# Patient Record
Sex: Male | Born: 1995 | Race: White | Hispanic: No | Marital: Single
Health system: Midwestern US, Community
[De-identification: ages and names within clinical notes are randomized; demographics above are authoritative.]

---

## 2014-06-19 ENCOUNTER — Inpatient Hospital Stay
Admit: 2014-06-19 | Discharge: 2014-06-19 | Disposition: A | Discharge status: Home / Self Care | Attending: Emergency Medicine
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
## 2020-10-14 IMAGING — CR KNEE RT 4 VWS MIN
1 series · 4 of 4 positions shown · non-contrast
Comparison: None

Right knee 4 views including weightbearing

INDICATIONS:  Right knee pain

[Series 1: tunnel · 0.17mm/px · 4 of 4 slices shown]
[im 1/4]
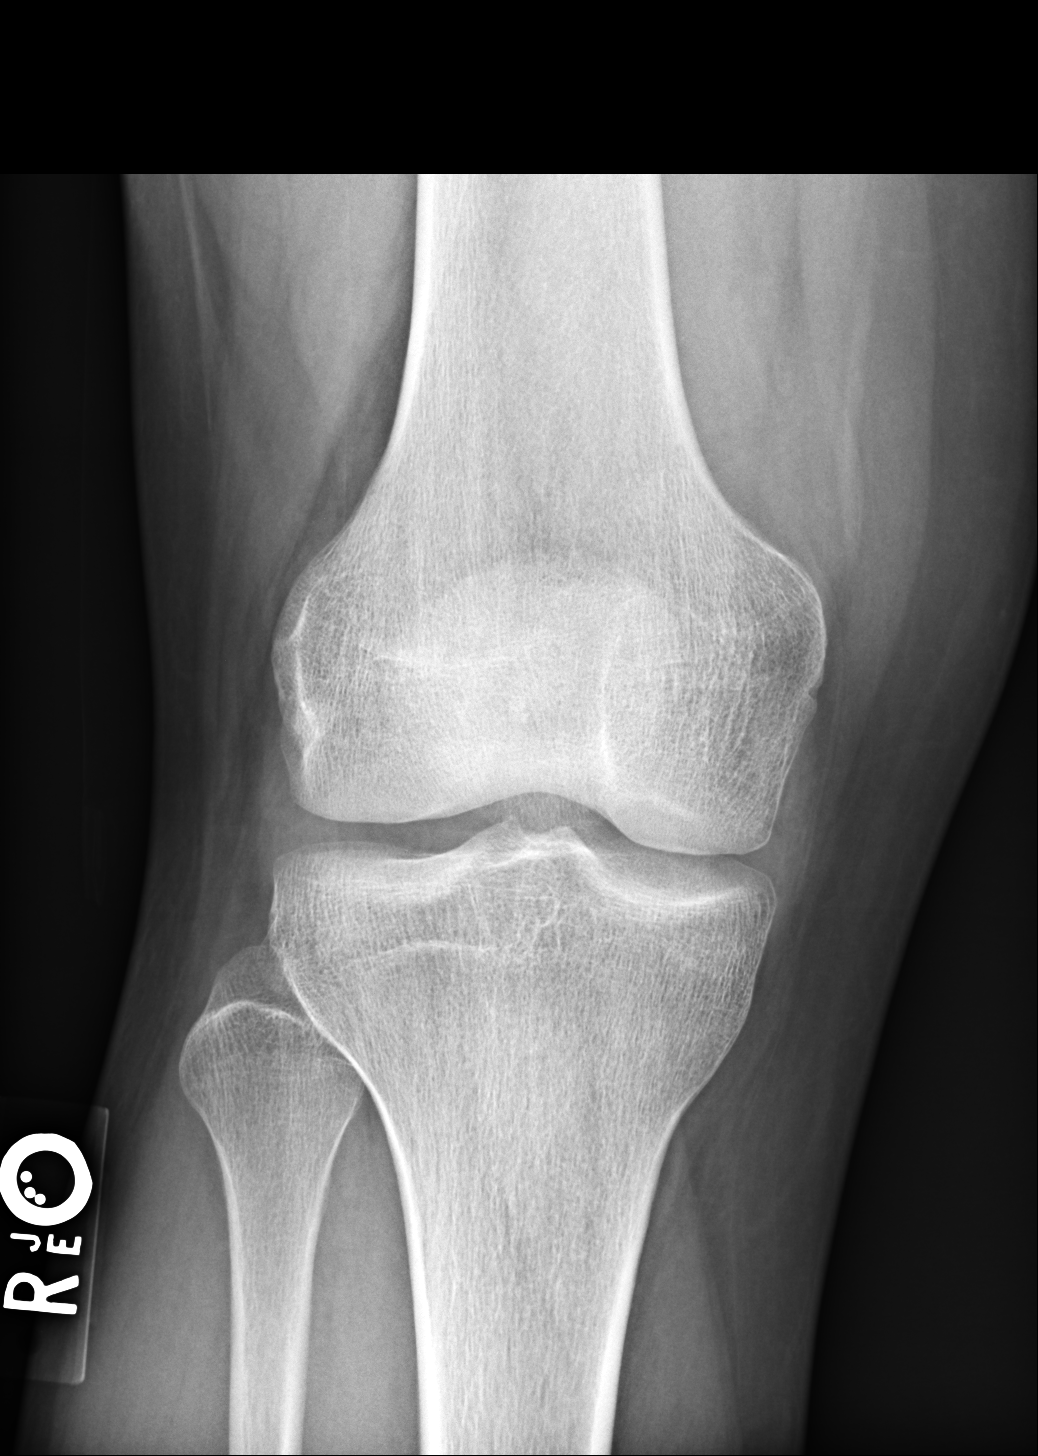
[im 2/4]
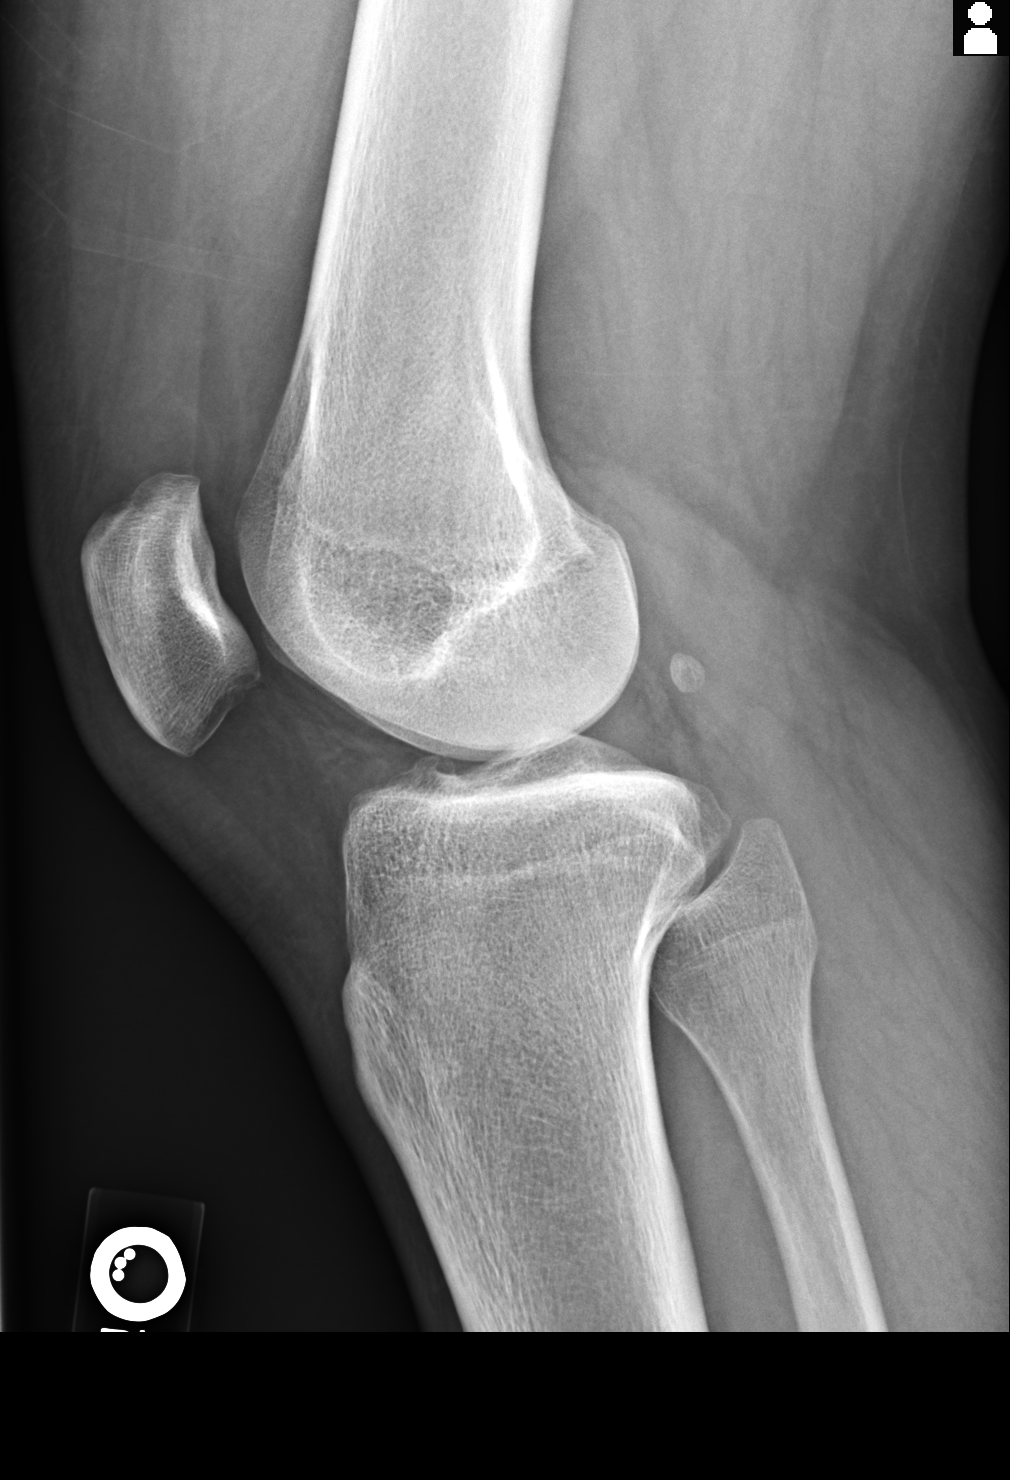
[im 3/4]
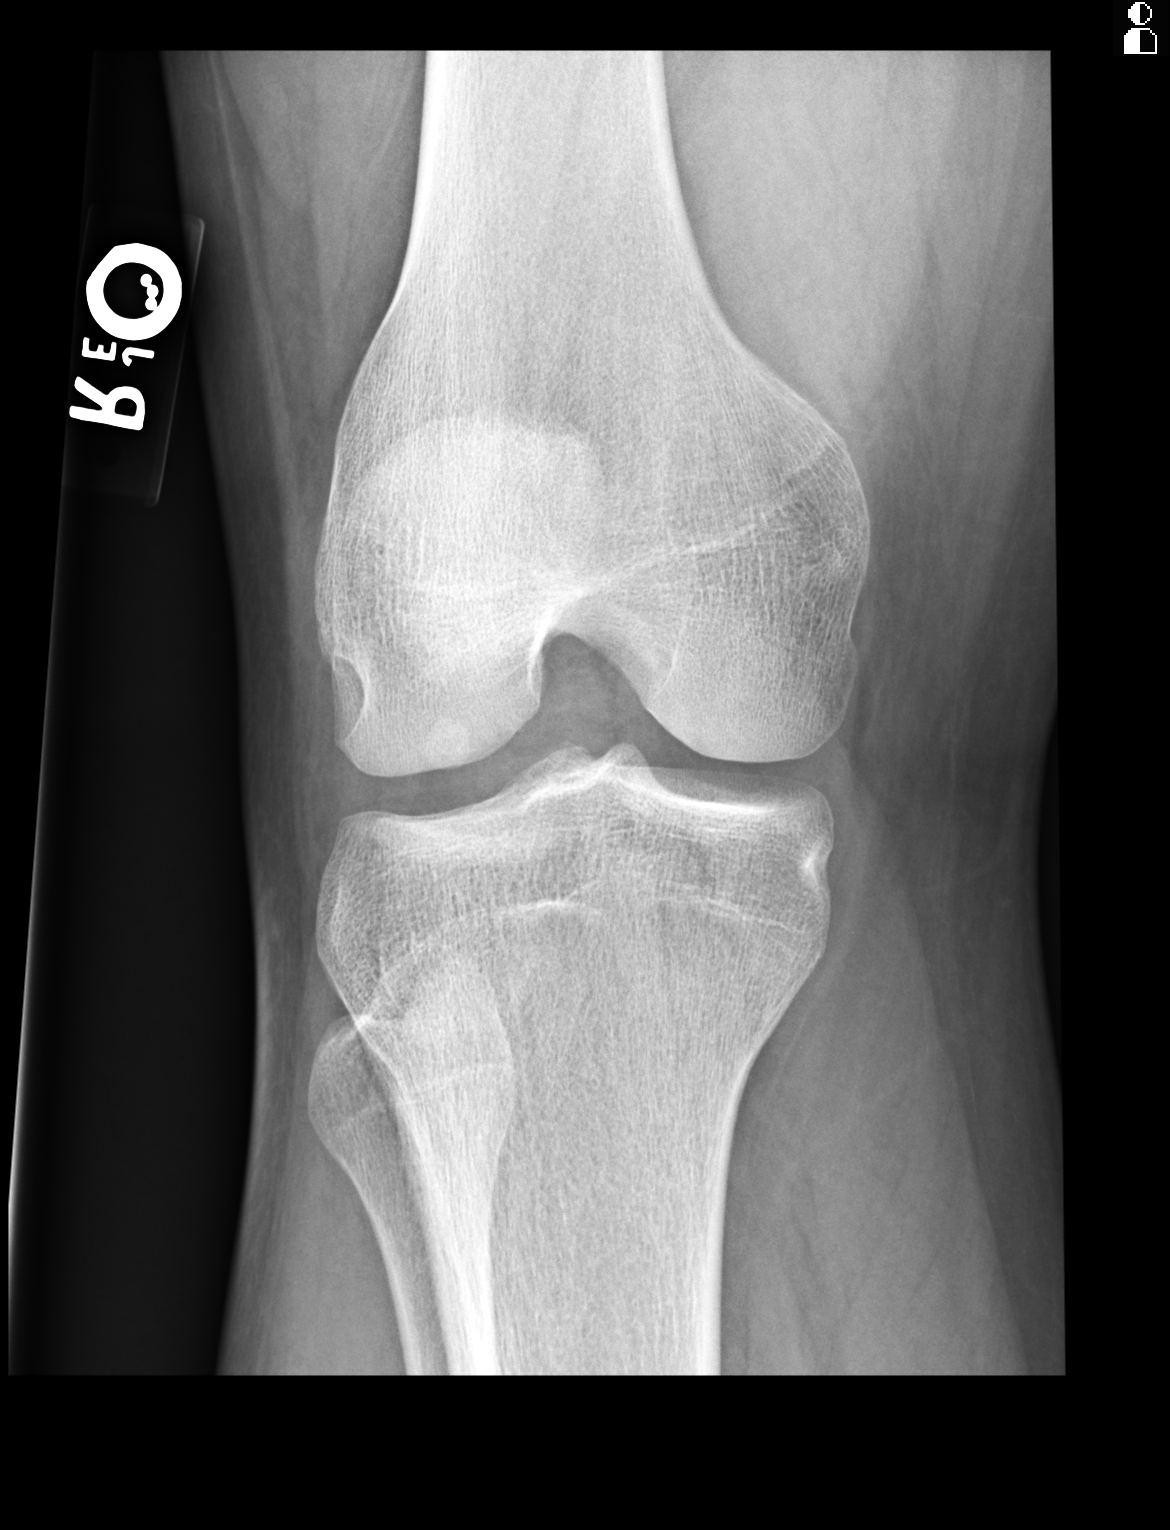
[im 4/4]
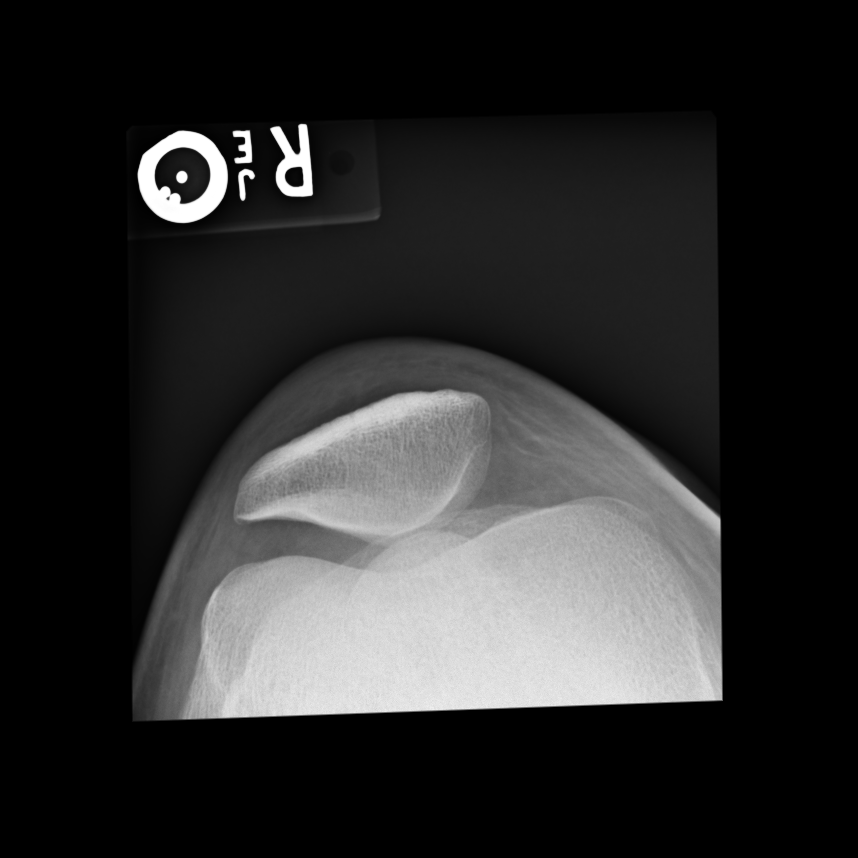

[4 of 4 positions shown; findings below may reference images not displayed]

FINDINGS: No bony or articular abnormality is identified. In particular, there is no evidence of fracture, dislocation, or radiopaque foreign body.
IMPRESSION: Normal right knee.

## 2021-06-26 IMAGING — MR MRI KNEE RT WO CONTRAST
5 series · 40 of 40 positions shown · non-contrast
Comparison: Right knee radiographs, 10/14/2020.

INDICATION: Strain of unspecified muscles and tendons at lower leg level, right leg, initial encounter, chronic knee pain, worse x 1 week
TECHNIQUE: Multiplanar, multisequence imaging of the right knee was performed without contrast.

[Series 3: t2_axial_fs · axial · 4.0mm · 0.62mm/px · z∈[-57,+78]mm · 8 of 28 slices shown]
[im 1/28]
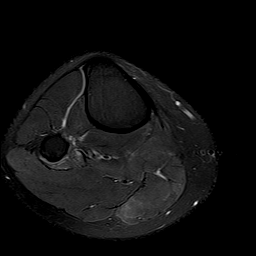
[im 4/28]
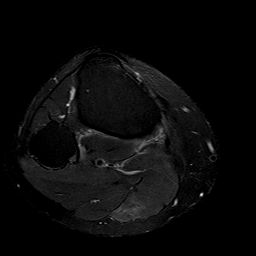
[im 8/28]
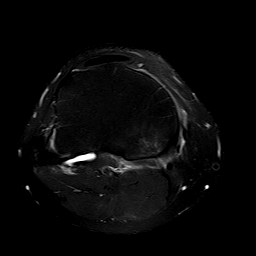
[im 12/28]
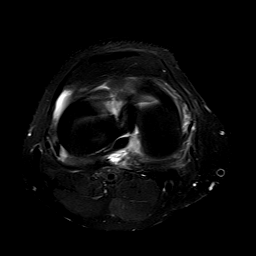
[im 16/28]
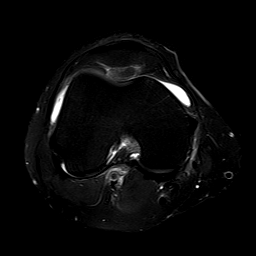
[im 20/28]
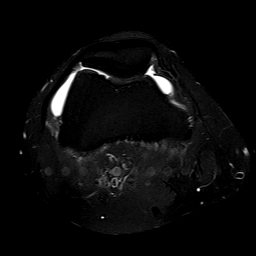
[im 24/28]
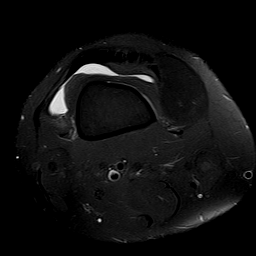
[im 28/28]
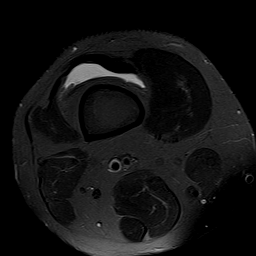

[Series 5: pd_sag_fs · sagittal · 3.0mm · 0.59mm/px · 9 of 28 slices shown]
[im 1/28]
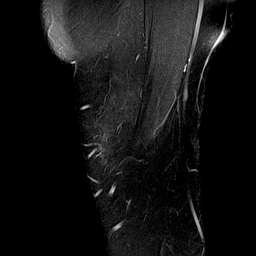
[im 4/28]
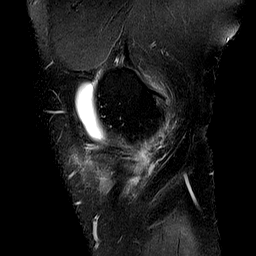
[im 7/28]
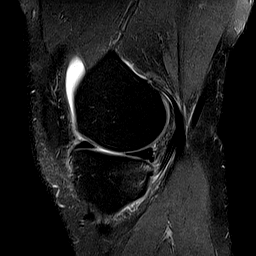
[im 11/28]
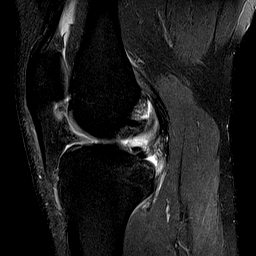
[im 14/28]
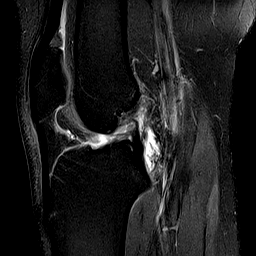
[im 17/28]
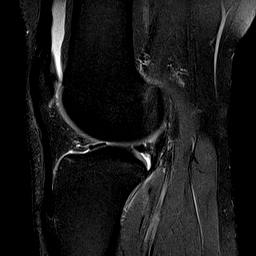
[im 21/28]
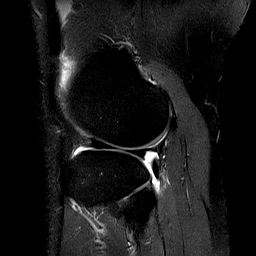
[im 24/28]
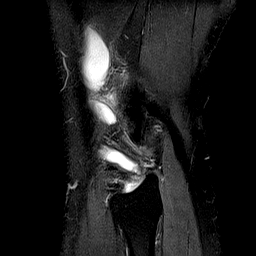
[im 28/28]
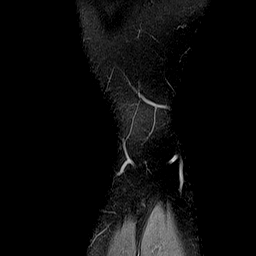

[Series 6: t2_sag_fs · sagittal · 3.0mm · 0.59mm/px · 9 of 28 slices shown]
[im 1/28]
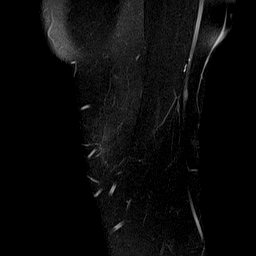
[im 4/28]
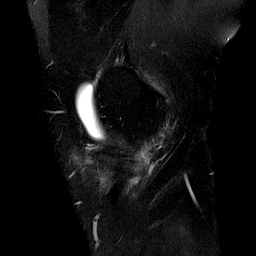
[im 7/28]
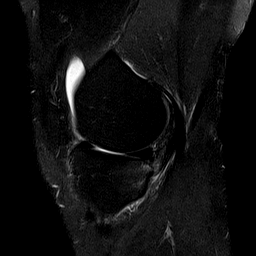
[im 11/28]
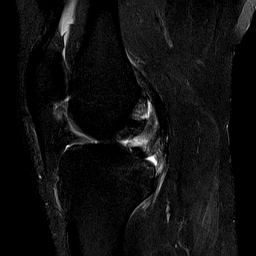
[im 14/28]
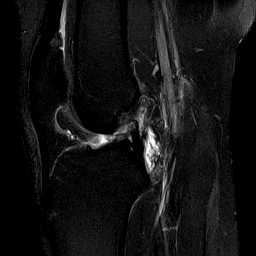
[im 17/28]
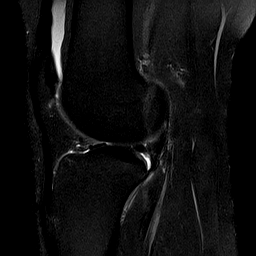
[im 21/28]
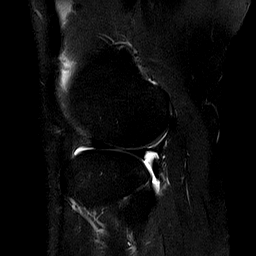
[im 24/28]
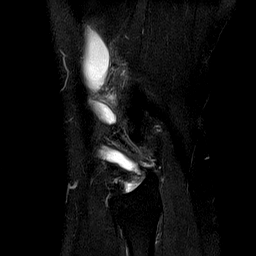
[im 28/28]
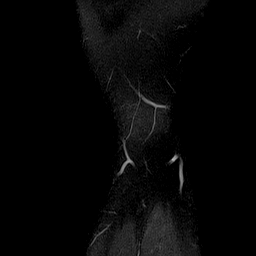

[Series 8: t2_cor_fs · coronal · 4.0mm · 0.45mm/px · 7 of 22 slices shown]
[im 1/22]
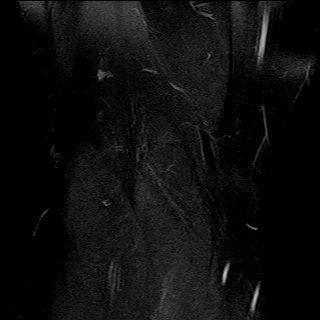
[im 4/22]
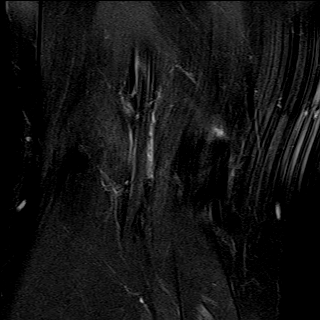
[im 8/22]
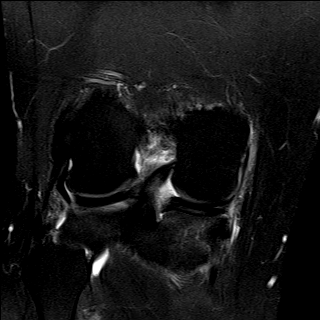
[im 11/22]
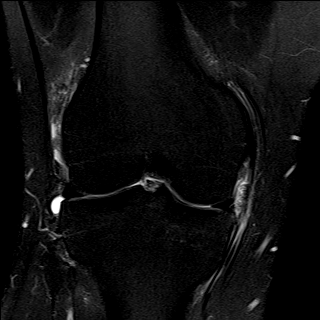
[im 15/22]
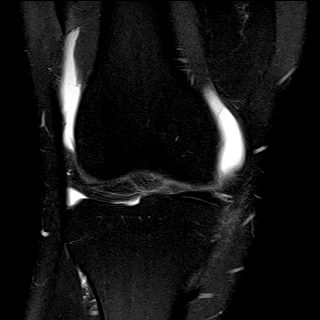
[im 18/22]
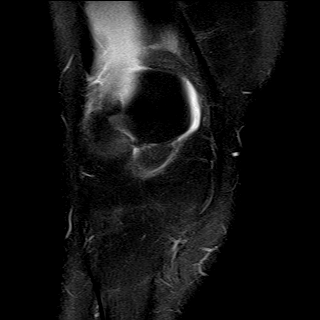
[im 22/22]
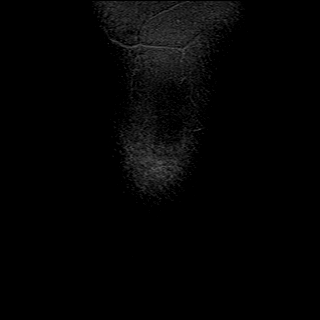

[Series 9: t1_cor · coronal · 4.0mm · 0.45mm/px · 7 of 22 slices shown]
[im 1/22]
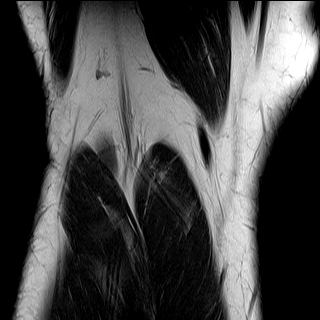
[im 4/22]
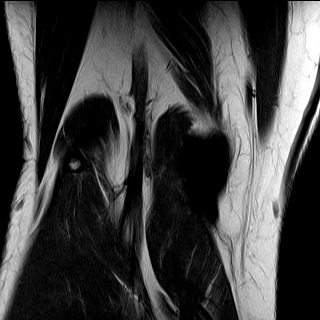
[im 8/22]
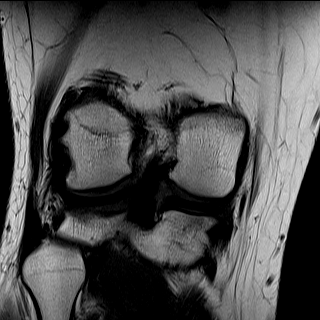
[im 11/22]
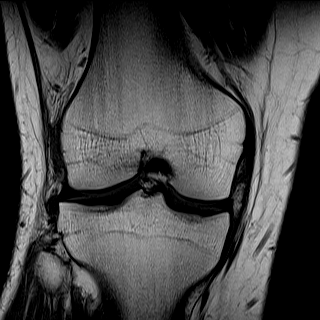
[im 15/22]
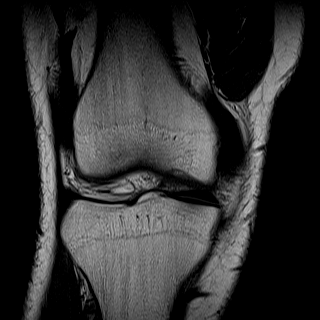
[im 18/22]
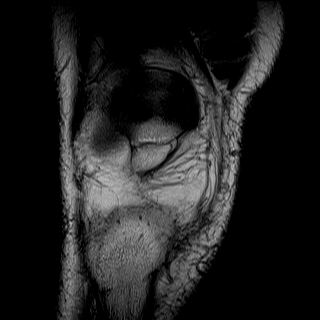
[im 22/22]
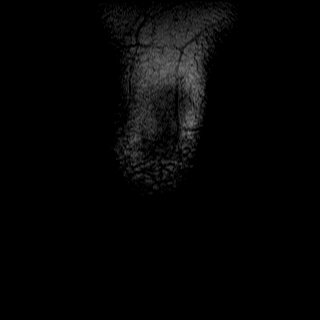

[40 of 40 positions shown; findings below may reference images not displayed]

FINDINGS: OSSEOUS: Osseous contusion of the far posterior aspect of the medial tibial plateau. Suspected mild/resolving contusions of the far posterior aspect of the lateral tibial plateau. No additional bone marrow edema. No aggressive or destructive osseous lesion.

MEDIAL JOINT COMPARTMENT:

Medial meniscus: Question small oblique inferior surface tear at the periphery of the posterior horn near the posterior horn/body junction.

Articular cartilage: Intact.

LATERAL JOINT COMPARTMENT:

Lateral meniscus: Intact. Tearing of the inferior meniscopopliteal fascicles.

Articular cartilage: Intact.

PATELLOFEMORAL JOINT AND EXTENSOR MECHANISM:

Quadriceps tendon: The visualized portion of the distal quadriceps tendon is intact.

Patella tendon: Intact.

Articular cartilage: Intact.

Alignment: The alignment of the patellofemoral joint is within normal limits, in the imaged position.

LIGAMENTS:

Anterior cruciate ligament: Complete tear of the anterior cruciate ligament.

Posterior cruciate ligament: Intact.

Medial collateral ligament: Mild periligamentous edema adjacent to the proximal fibers of the MCL, compatible with a grade 1 sprain. No fiber discontinuity or laxity.

Lateral collateral ligament: Intact.

MUSCULOTENDINOUS: Strain of the medial head of the gastrocnemius muscle. The biceps femoris and popliteal tendons are intact. The remaining visualized musculotendinous structures are intact.

OTHER: Moderate knee joint effusion. Trace fluid is present within the popliteal recess.
IMPRESSION: 1.
Complete tear of the anterior cruciate ligament, with contusions of the posterior aspect of the medial tibial plateau.

2.
Grade 1 sprain of the MCL.

3.
Question small inferior surface tear at the periphery of the posterior horn of the medial meniscus. The medial compartment articular cartilage is intact.

4.
Strain of the medial head of gastrocnemius muscle. The biceps femoris and popliteal tendons are intact.

## 2022-05-18 IMAGING — MR MRI KNEE RT WO CONTRAST
4 of 5 series · 22 of 40 positions shown · non-contrast
Comparison: Right knee MRI 06/26/2021. Right knee radiographs 10/14/2020.

Images Obtained from [facility]
INDICATION: Tear of medial meniscus, current injury, right knee, initial encounter.
TECHNIQUE: Multiplanar, multiecho imaging of the right knee was performed, including T1-weighted and fluid sensitive sequences without intravenous contrast administration.

[Series 8: t2_axial_fs · axial · right · 3.0mm · 0.50mm/px · z∈[-60,+64]mm · 8 of 32 slices shown]
[im 1/32]
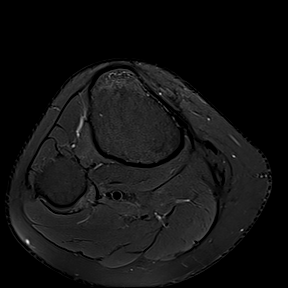
[im 4/32]
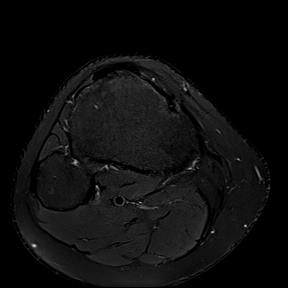
[im 11/32]
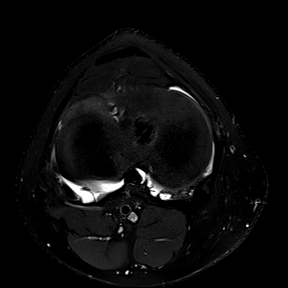
[im 14/32]
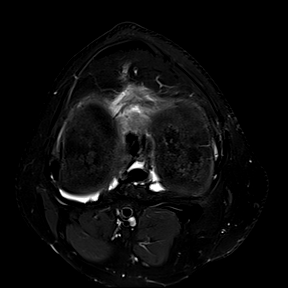
[im 18/32]
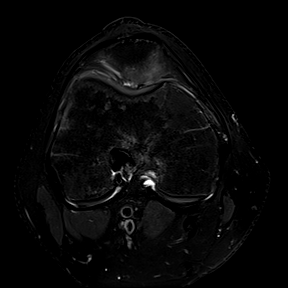
[im 21/32]
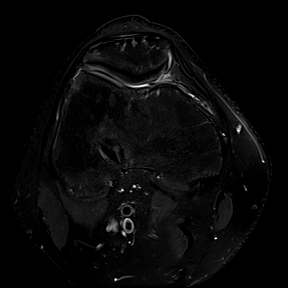
[im 28/32]
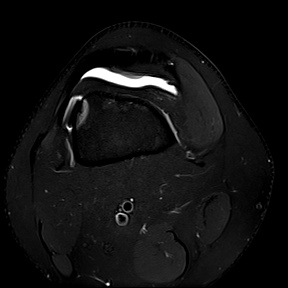
[im 32/32]
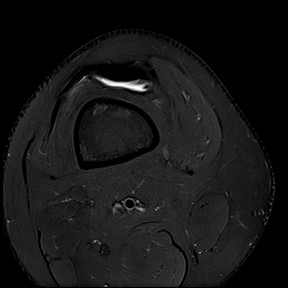

[Series 9: pd_sag_fs · sagittal · right · 3.0mm · 0.24mm/px · 7 of 26 slices shown]
[im 1/26]
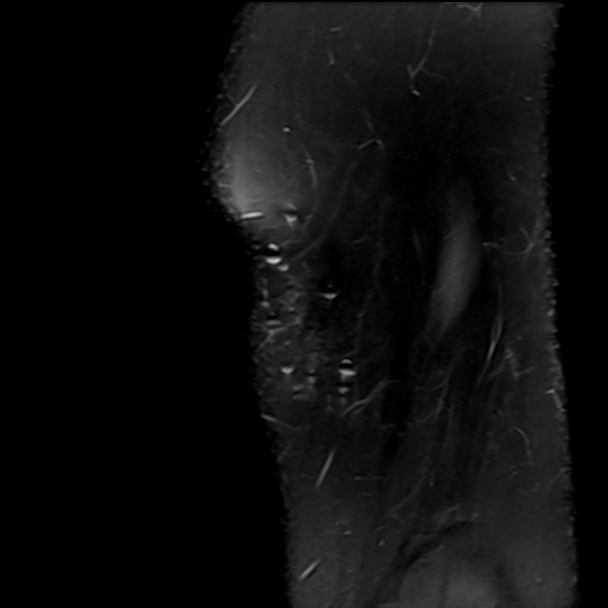
[im 5/26]
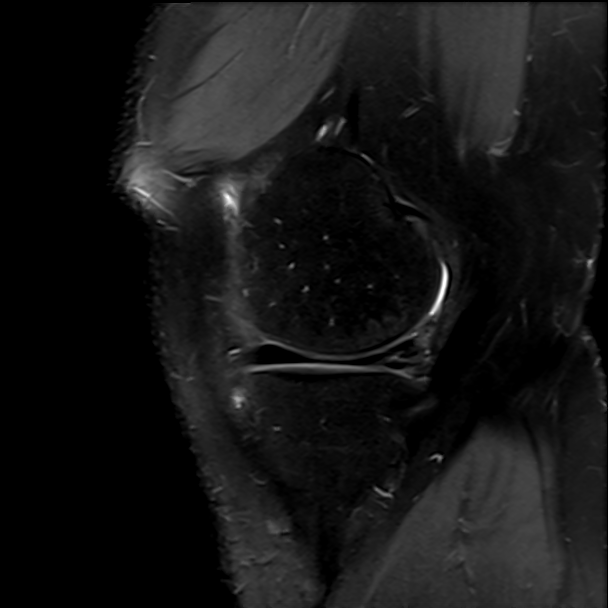
[im 9/26]
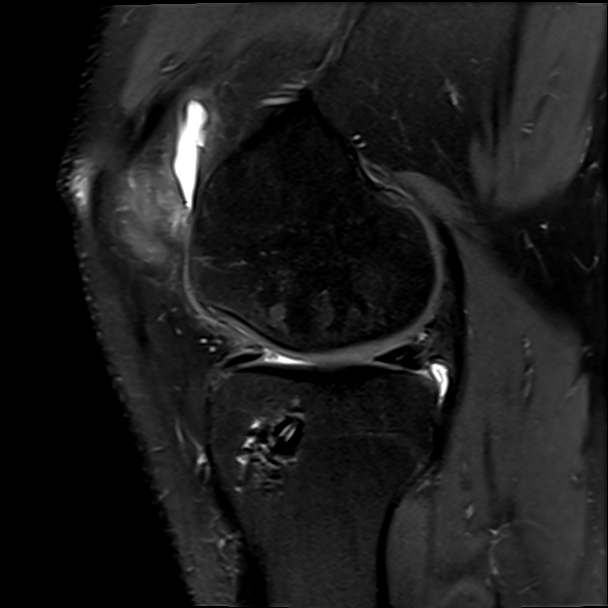
[im 13/26]
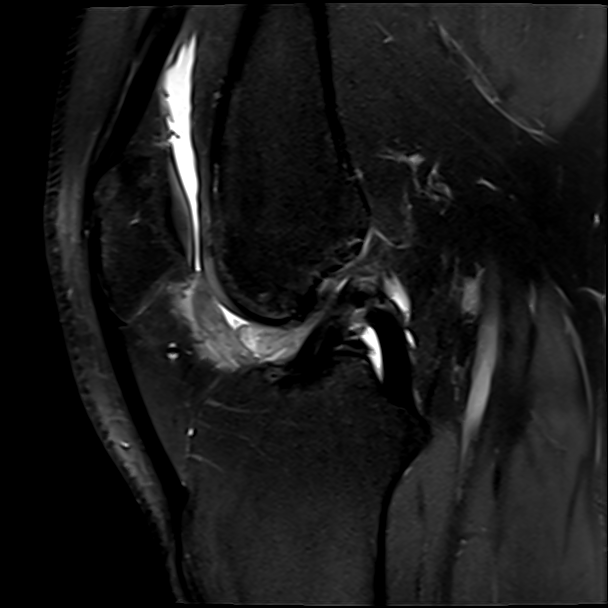
[im 17/26]
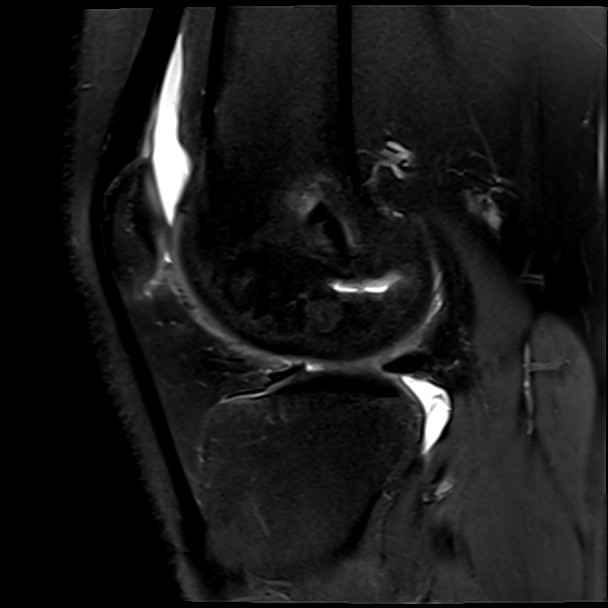
[im 21/26]
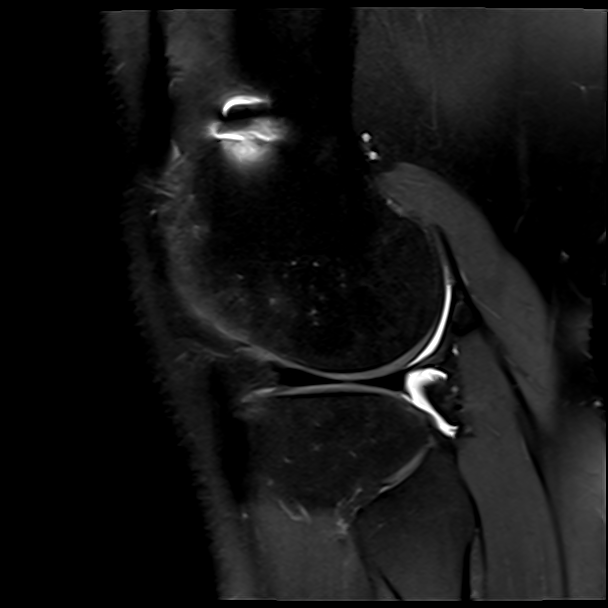
[im 26/26]
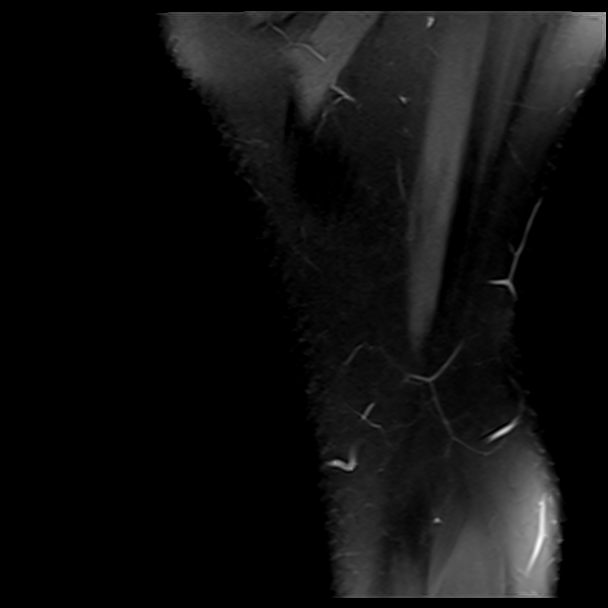

[Series 10: t2_sag_fs · sagittal · right · 3.0mm · 0.24mm/px · 4 of 26 slices shown]
[im 1/26]
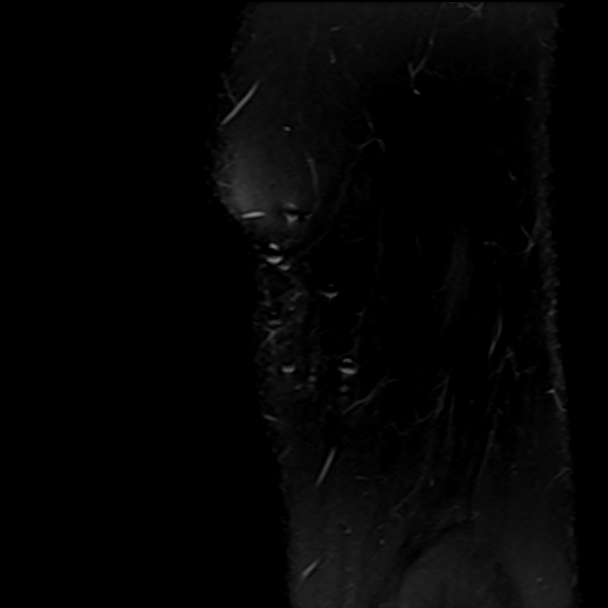
[im 5/26]
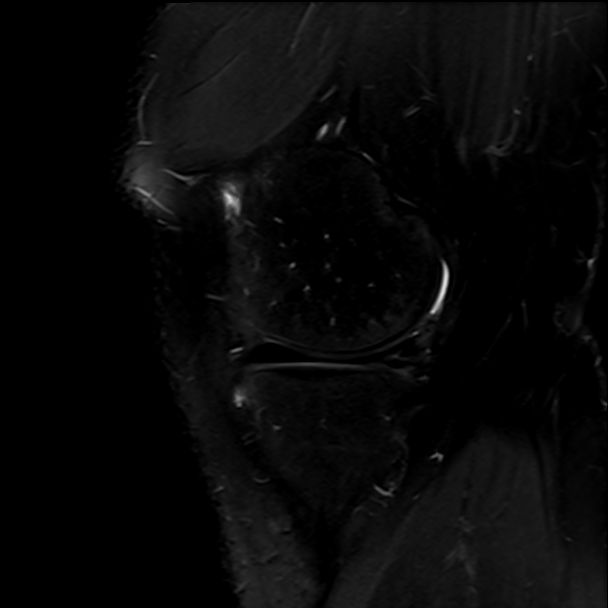
[im 13/26]
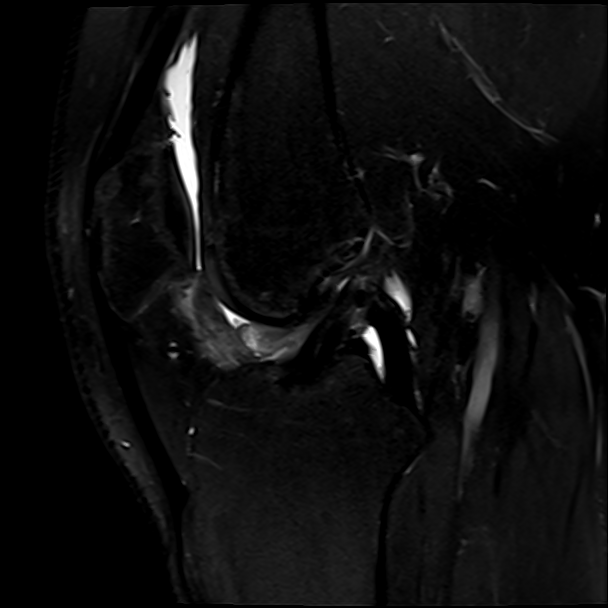
[im 21/26]
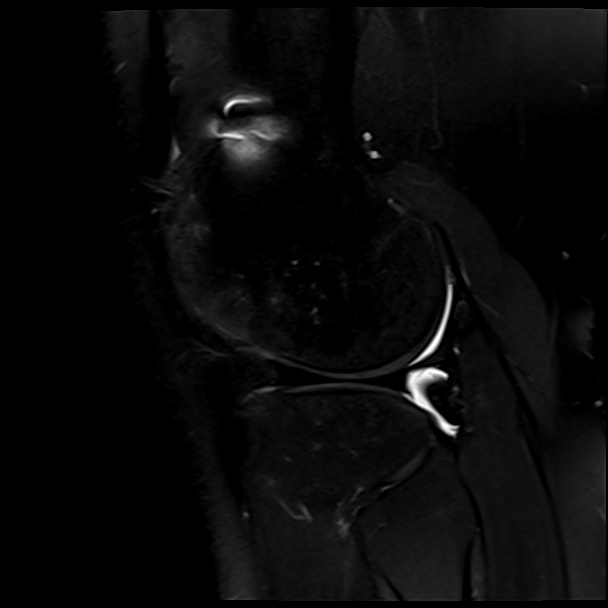

[Series 11: t1_cor · coronal · right · 3.5mm · 0.42mm/px · 3 of 28 slices shown]
[im 4/28]
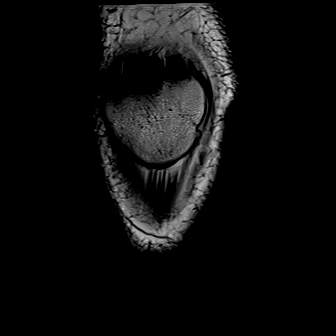
[im 16/28]
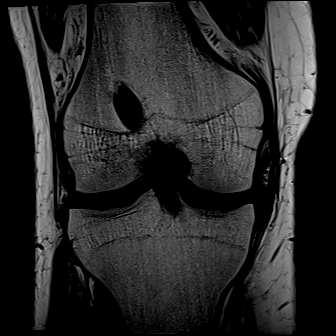
[im 24/28]
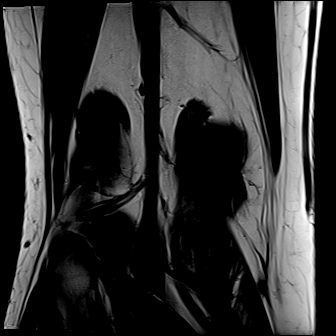

[22 of 40 positions shown; findings below may reference images not displayed]

FINDINGS: MEDIAL MENISCUS:  Irregularity/fragmentation appearance of the posterior horn and body, likely status post partial meniscectomy. Small parameniscal cyst along the posterior root. No definitive
evidence of re-tear.
LATERAL MENISCUS:  Subtle irregularity along the superior surface of the posterior horn, likely partial meniscectomy changes. No evidence of re-tear.
ACL:  Status post ACL reconstruction with intact ACL graft.
PCL:  Intact.
MCL:  Intact.
LATERAL LIGAMENTS AND TENDONS:  Intact.
EXTENSOR MECHANISM:  The quadriceps and patellar tendons are intact.
FAT PADS:   Normal.
CARTILAGE:
Patellofemoral compartment:  Cartilage thinning suggested at the medial patellar facet, more conspicuous than prior.
Medial compartment:  Chondral surface irregularity at the inner aspect of the weightbearing femoral condyle and tibial plateau. Interval progression.
Lateral compartment: Intact.
BONE MARROW: Normal.
Small joint effusion. No Baker's cyst cyst.
IMPRESSION: 1.  Status post ACL reconstruction with intact ACL graft, new from prior MRI of 06/26/2021.
[DATE].  Irregular/fragmented appearance of the posterior horn and body of medial meniscus, likely status post partial meniscectomy. No evidence of re-tear.
3.  Mild medial and patellofemoral compartment chondral abnormalities, progressed from prior MRI of 06/26/2021.
